# Patient Record
Sex: Female | Born: 1990 | Race: White | Hispanic: No | Marital: Single | Smoking: Never smoker
Health system: Southern US, Community
[De-identification: ages and names within clinical notes are randomized; demographics above are authoritative.]

---

## 2011-04-07 ENCOUNTER — Emergency Department (HOSPITAL_BASED_OUTPATIENT_CLINIC_OR_DEPARTMENT_OTHER)
Admission: EM | Admit: 2011-04-07 | Discharge: 2011-04-07 | Disposition: A | Discharge status: Home / Self Care | Payer: Managed Care, Other (non HMO) | Attending: Emergency Medicine | Admitting: Emergency Medicine

## 2011-04-07 ENCOUNTER — Emergency Department (INDEPENDENT_AMBULATORY_CARE_PROVIDER_SITE_OTHER): Payer: Managed Care, Other (non HMO)

## 2011-04-07 DIAGNOSIS — B2790 Infectious mononucleosis, unspecified without complication: Secondary | ICD-10-CM | POA: Insufficient documentation

## 2011-04-07 DIAGNOSIS — J351 Hypertrophy of tonsils: Secondary | ICD-10-CM

## 2011-04-07 LAB — DIFFERENTIAL
Basophils Absolute: 0 10*3/uL (ref 0.0–0.1)
Basophils Relative: 0 % (ref 0–1)
Eosinophils Absolute: 0 10*3/uL (ref 0.0–0.7)
Eosinophils Relative: 0 % (ref 0–5)
Metamyelocytes Relative: 0 %
Monocytes Absolute: 0.2 10*3/uL (ref 0.1–1.0)
Monocytes Relative: 1 % — ABNORMAL LOW (ref 3–12)
Neutro Abs: 9.3 10*3/uL — ABNORMAL HIGH (ref 1.7–7.7)
Neutrophils Relative %: 41 % — ABNORMAL LOW (ref 43–77)
nRBC: 0 /100 WBC

## 2011-04-07 LAB — BASIC METABOLIC PANEL
BUN: 2 mg/dL — ABNORMAL LOW (ref 6–23)
Calcium: 9.3 mg/dL (ref 8.4–10.5)
Creatinine, Ser: 0.5 mg/dL (ref 0.4–1.2)
GFR calc non Af Amer: >60 mL/min (ref >60)
Glucose, Bld: 98 mg/dL (ref 70–99)

## 2011-04-07 LAB — CBC
MCH: 29.6 pg (ref 26.0–34.0)
MCHC: 34 g/dL (ref 30.0–36.0)
Platelets: 216 10*3/uL (ref 150–400)

## 2011-04-07 LAB — RAPID STREP SCREEN (MED CTR MEBANE ONLY): Streptococcus, Group A Screen (Direct): NEGATIVE

## 2011-04-08 ENCOUNTER — Emergency Department (INDEPENDENT_AMBULATORY_CARE_PROVIDER_SITE_OTHER): Payer: Managed Care, Other (non HMO)

## 2011-04-08 ENCOUNTER — Emergency Department (HOSPITAL_BASED_OUTPATIENT_CLINIC_OR_DEPARTMENT_OTHER)
Admission: EM | Admit: 2011-04-08 | Discharge: 2011-04-08 | Disposition: A | Discharge status: Nursing Home (Includes ICF) | Payer: Managed Care, Other (non HMO) | Attending: Emergency Medicine | Admitting: Emergency Medicine

## 2011-04-08 DIAGNOSIS — J322 Chronic ethmoidal sinusitis: Secondary | ICD-10-CM | POA: Insufficient documentation

## 2011-04-08 DIAGNOSIS — J029 Acute pharyngitis, unspecified: Secondary | ICD-10-CM

## 2011-04-08 DIAGNOSIS — R748 Abnormal levels of other serum enzymes: Secondary | ICD-10-CM | POA: Insufficient documentation

## 2011-04-08 DIAGNOSIS — B2790 Infectious mononucleosis, unspecified without complication: Secondary | ICD-10-CM | POA: Insufficient documentation

## 2011-04-08 DIAGNOSIS — Z79899 Other long term (current) drug therapy: Secondary | ICD-10-CM | POA: Insufficient documentation

## 2011-04-08 DIAGNOSIS — R599 Enlarged lymph nodes, unspecified: Secondary | ICD-10-CM

## 2011-04-08 DIAGNOSIS — R509 Fever, unspecified: Secondary | ICD-10-CM

## 2011-04-08 LAB — DIFFERENTIAL
Basophils Absolute: 0 10*3/uL (ref 0.0–0.1)
Basophils Relative: 0 % (ref 0–1)
Eosinophils Absolute: 0 10*3/uL (ref 0.0–0.7)
Eosinophils Relative: 0 % (ref 0–5)
Lymphs Abs: 8.5 10*3/uL — ABNORMAL HIGH (ref 0.7–4.0)

## 2011-04-08 LAB — HEPATIC FUNCTION PANEL
AST: 223 U/L — ABNORMAL HIGH (ref 0–37)
Bilirubin, Direct: 0.3 mg/dL (ref 0.0–0.3)
Indirect Bilirubin: 1.7 mg/dL — ABNORMAL HIGH (ref 0.3–0.9)

## 2011-04-08 LAB — CBC
HCT: 39 % (ref 36.0–46.0)
MCHC: 33.6 g/dL (ref 30.0–36.0)
RDW: 13.2 % (ref 11.5–15.5)

## 2011-04-08 LAB — BASIC METABOLIC PANEL
Calcium: 8.8 mg/dL (ref 8.4–10.5)
GFR calc non Af Amer: >60 mL/min (ref >60)
Glucose, Bld: 94 mg/dL (ref 70–99)
Sodium: 137 mEq/L (ref 135–145)

## 2011-04-08 MED ORDER — IOHEXOL 350 MG/ML SOLN
100.0000 mL | Freq: Once | INTRAVENOUS | Status: AC | PRN
  Administered 2011-04-08: 100 mL via INTRAVENOUS

## 2012-01-11 IMAGING — CT CT NECK W/ CM
3 of 5 series · 13 of 33 positions shown, 16 images · IV contrast (omnipaque)
Comparison: 04/07/2011

CLINICAL DATA: Fever.  Sore throat.

CT NECK WITH CONTRAST
TECHNIQUE: Multidetector CT imaging of the neck was performed with
intravenous contrast.
Contrast: 100 ml Omnipaque 350

[Series 2: neck 2.0 b31s · axial · 0.40mm/px · z∈[-237,-77]mm · 5 of 120 slices shown, 7 images]
[im 20/120  soft-tissue]
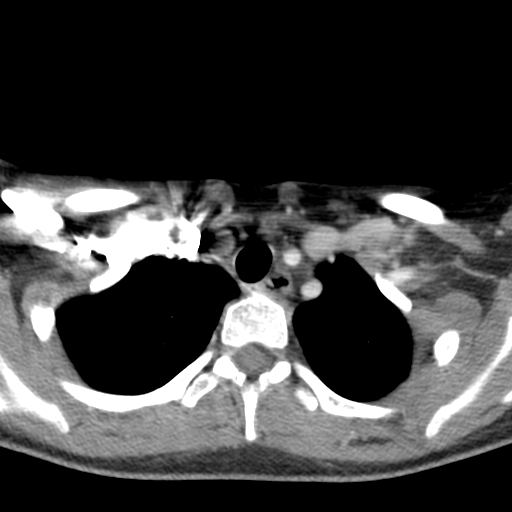
[im 20/120  bone]
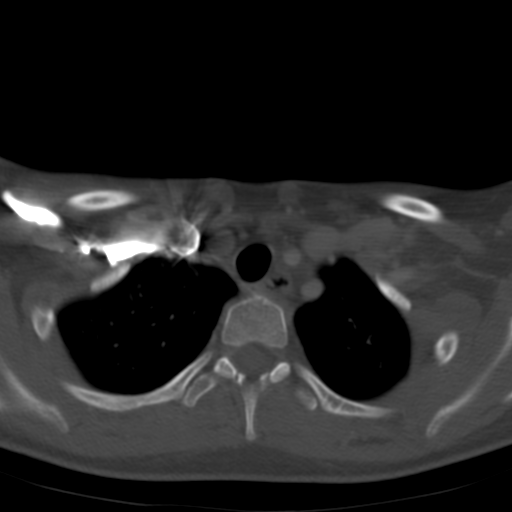
[im 40/120  bone]
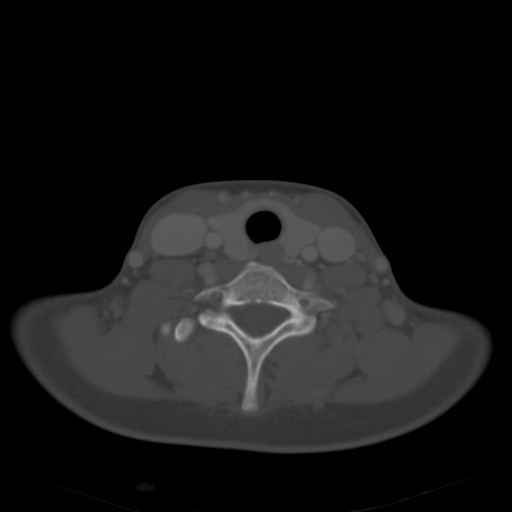
[im 60/120  bone]
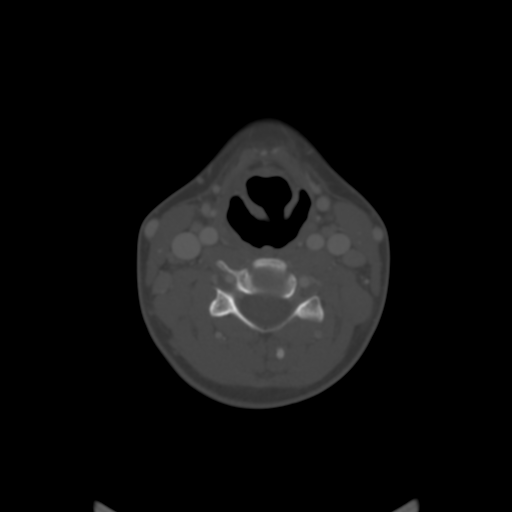
[im 80/120  bone]
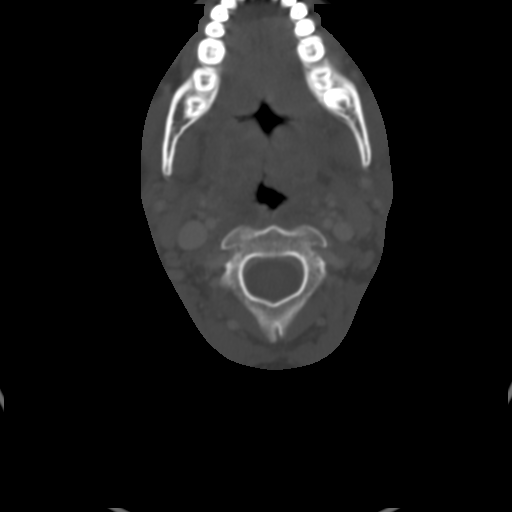
[im 100/120  soft-tissue]
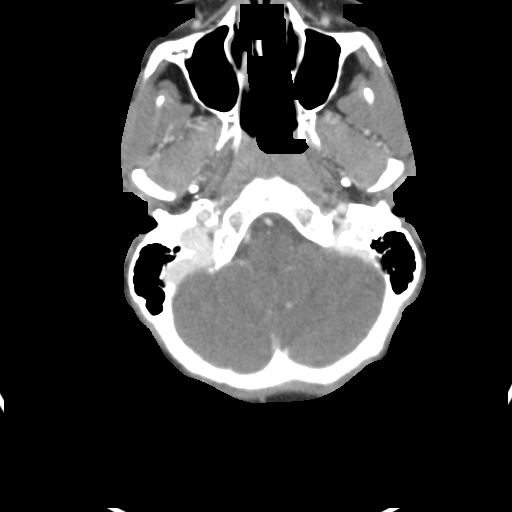
[im 100/120  bone]
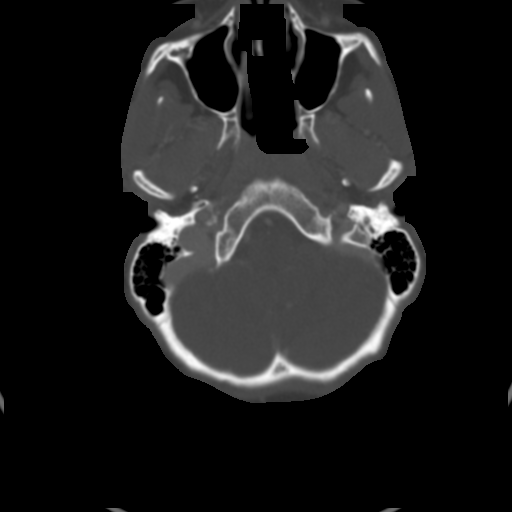

[Series 4: neck 2.0 coronal · coronal · 0.38mm/px · 3 of 71 slices shown]
[im 15/71  bone]
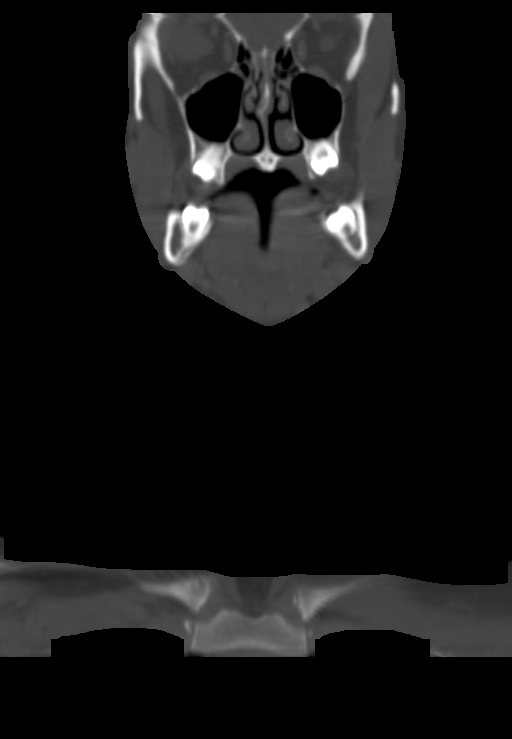
[im 29/71  bone]
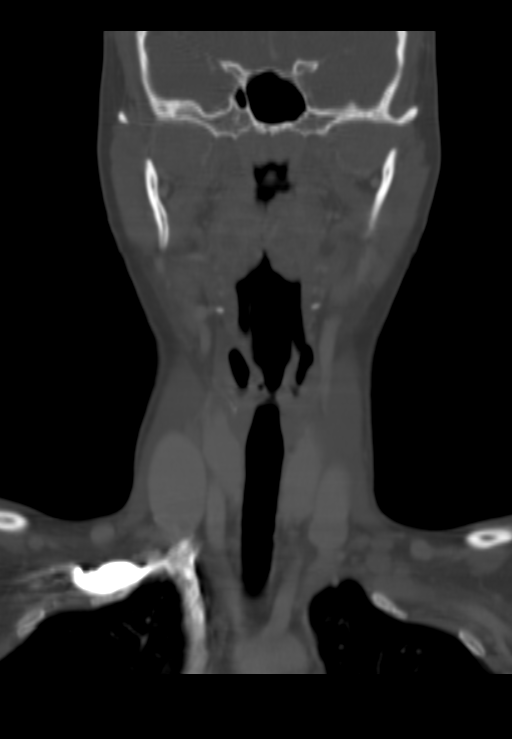
[im 43/71  bone]
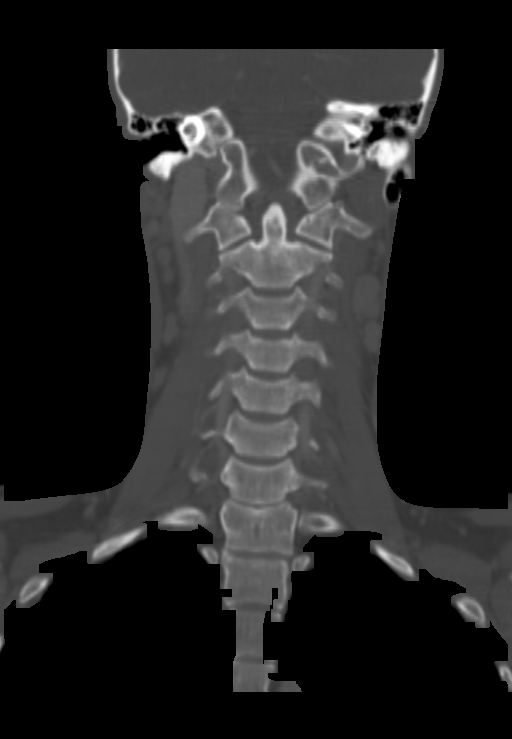

[Series 5: neck 2.0 sagittal · sagittal · 0.47mm/px · 5 of 70 slices shown, 6 images]
[im 24/70  bone]
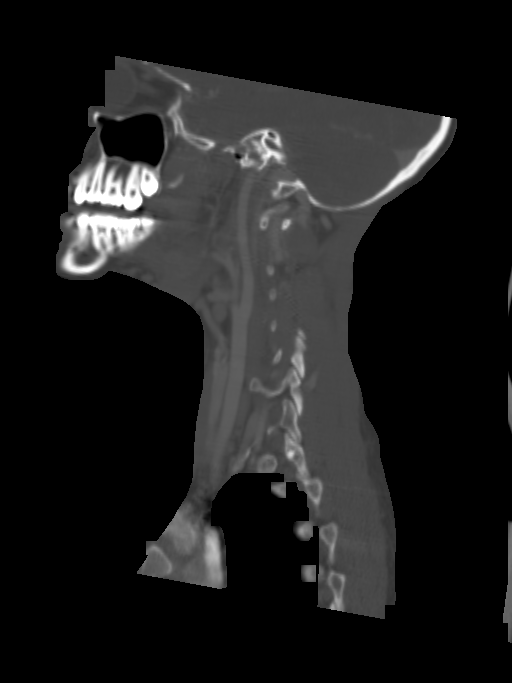
[im 29/70  bone]
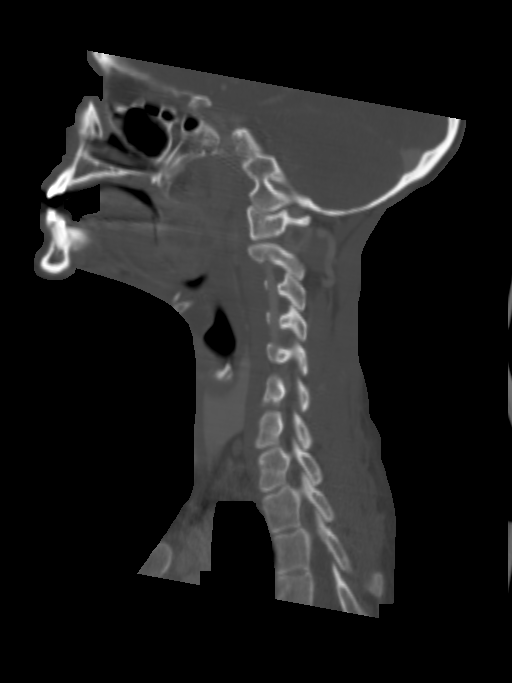
[im 35/70  soft-tissue]
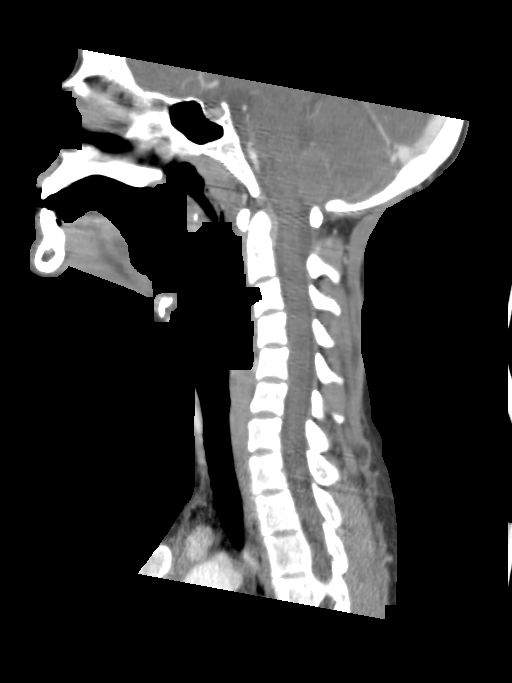
[im 35/70  bone]
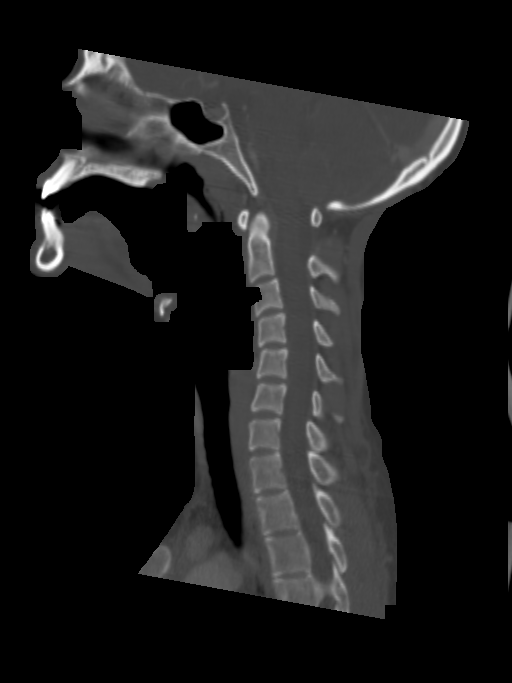
[im 41/70  bone]
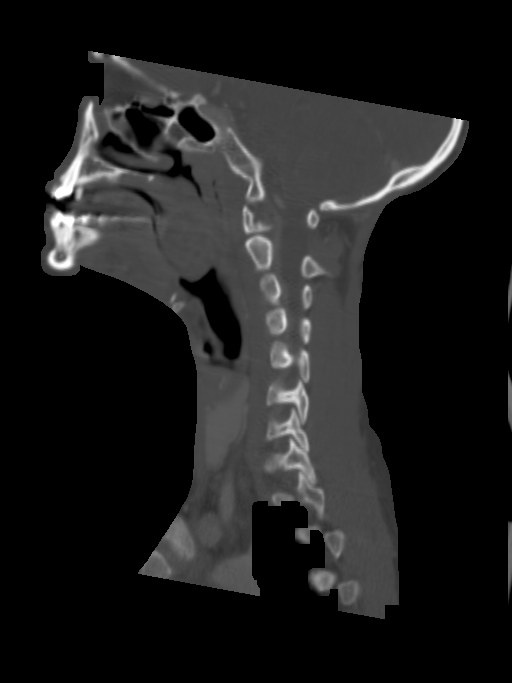
[im 47/70  bone]
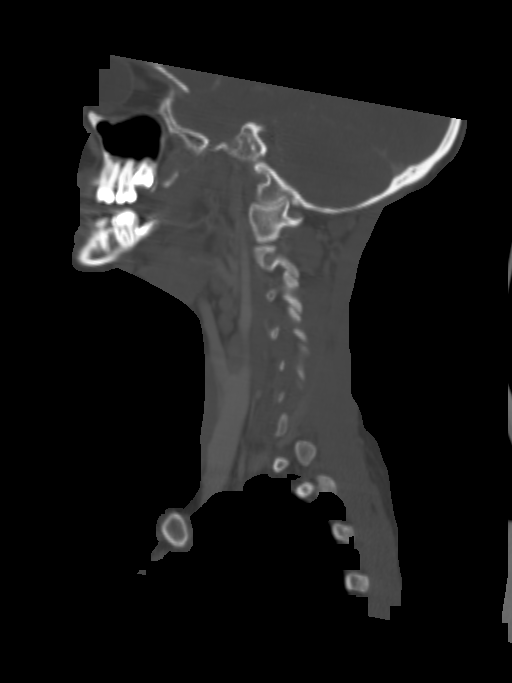

[13 of 33 positions shown; findings below may reference images not displayed]

FINDINGS: Mild chronic left ethmoid sinusitis is present.

There is considerable prominence of the palatine tonsils and
tonsillar pillars, with internal heterogeneity but without a
discrete/drainable tonsillar abscess.  The swelling is symmetric,
and mildly effaces the nasopharynx.

Otherwise the supraglottic and glottic tissues appear unremarkable.
No prevertebral soft tissue swelling or prevertebral abscess is
identified.  The adenoidal tissue is in the upper normal size range
for age.

Adenopathy noted in the neck, with a left station II lymph node
having a short axis diameter of 14.9 cm on image 46 of series 2,
and a right station II lymph node measuring 1.3 cm in short axis
diameter on image 48 of series 2.

Multiple additional scattered cervical and posterior triangle nodes
are present.  These are most likely reactive given the patient's
age and the tonsillar swelling.
IMPRESSION: 1.  Symmetric hypertrophy/prominence of the palatine tonsils and
tonsillar pillars, causing some effacement of the nasal airway.
The remaining glottic and supraglottic structures appear normal.
2.  Mild adenopathy in the neck, likely reactive.
3.  Mild chronic left ethmoid sinusitis.
4.  No drainable abscess identified.

## 2019-01-05 ENCOUNTER — Encounter: Payer: Self-pay | Admitting: Family Medicine

## 2019-01-05 ENCOUNTER — Ambulatory Visit: Payer: Managed Care, Other (non HMO) | Admitting: Family Medicine

## 2019-01-05 VITALS — BP 116/81 | HR 64 | Ht 62.0 in | Wt 115.0 lb

## 2019-01-05 DIAGNOSIS — M25579 Pain in unspecified ankle and joints of unspecified foot: Secondary | ICD-10-CM

## 2019-01-05 DIAGNOSIS — M2141 Flat foot [pes planus] (acquired), right foot: Secondary | ICD-10-CM | POA: Diagnosis not present

## 2019-01-05 DIAGNOSIS — M2142 Flat foot [pes planus] (acquired), left foot: Secondary | ICD-10-CM | POA: Diagnosis not present

## 2019-01-05 NOTE — Progress Notes (Signed)
PCP: Patient, No Pcp Per  Subjective:   HPI: Patient is a 28 y.o. female here for bilateral foot pain since May 2019.  Patient is currently 1/10.  It can be 5/10 at its worst.  She states her pain started after changing to a new job where she is standing all day.  Her pain is worse at the end of the workday.  It also worsens throughout the week.  It improves over the weekend when she is able to rest.  She localizes her pain to the plantar surface of the calcaneus as well as the lateral midfoot and proximal metatarsals.  Pain is worse with prolonged standing.  It did slightly improve after she buy new, more supportive shoes.  She denies any swelling, erythema, bruising.  No numbness or tingling.  No skin changes  History reviewed. No pertinent past medical history.  No current outpatient medications on file prior to visit.   No current facility-administered medications on file prior to visit.     History reviewed. No pertinent surgical history.  Allergies  Allergen Reactions  . Amoxicillin     Social History   Socioeconomic History  . Marital status: Single    Spouse name: Not on file  . Number of children: Not on file  . Years of education: Not on file  . Highest education level: Not on file  Occupational History  . Not on file  Social Needs  . Financial resource strain: Not on file  . Food insecurity:    Worry: Not on file    Inability: Not on file  . Transportation needs:    Medical: Not on file    Non-medical: Not on file  Tobacco Use  . Smoking status: Never Smoker  . Smokeless tobacco: Never Used  Substance and Sexual Activity  . Alcohol use: Not on file  . Drug use: Not on file  . Sexual activity: Not on file  Lifestyle  . Physical activity:    Days per week: Not on file    Minutes per session: Not on file  . Stress: Not on file  Relationships  . Social connections:    Talks on phone: Not on file    Gets together: Not on file    Attends religious service:  Not on file    Active member of club or organization: Not on file    Attends meetings of clubs or organizations: Not on file    Relationship status: Not on file  . Intimate partner violence:    Fear of current or ex partner: Not on file    Emotionally abused: Not on file    Physically abused: Not on file    Forced sexual activity: Not on file  Other Topics Concern  . Not on file  Social History Narrative  . Not on file    History reviewed. No pertinent family history.  BP 116/81   Pulse 64   Ht 5\' 2"  (1.575 m)   Wt 115 lb (52.2 kg)   BMI 21.03 kg/m   Review of Systems: See HPI above.     Objective:  Physical Exam:  Gen: awake, alert, NAD, comfortable in exam room Pulm: breathing unlabored  Right foot: Inspection:  No obvious bony deformity.  No swelling, erythema, or bruising.  Normal arch while seated.  Longitudinal arch collapse with standing.  Transverse arch collapse with observed toe splaying with standing.  Very mild calcaneal valgus Palpation: No tenderness to palpation ROM: Full  ROM of  the ankle. Normal midfoot flexibility Strength: 5/5 strength ankle in all planes Neurovascular: N/V intact distally in the lower extremity Special tests: Negative squeeze. Normal calcaneal motion with heel raise  Left foot: Inspection:  No obvious bony deformity.  No swelling, erythema, or bruising.  Normal arch while seated.  Longitudinal arch collapse with standing.  Transverse arch collapse with observed toe splaying with standing.  Very mild calcaneal valgus Palpation: No tenderness to palpation ROM: Full  ROM of the ankle. Normal midfoot flexibility Strength: 5/5 strength ankle in all planes Neurovascular: N/V intact distally in the lower extremity Special tests: Negative squeeze. Normal calcaneal motion with heel raise   Assessment & Plan:  1.  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  As result she has some heel pain and symptoms  of lateral column overload. -Today we placed her in green hapad inserts.  She will wear these for the next few weeks.  May consider adding a lateral heel wedge.  If she does notice improvement, consider custom orthotics

## 2019-01-05 NOTE — Patient Instructions (Signed)
Your exam is reassuring. Your pain is due to prolonged weightbearing with excessive pressure on the lateral column of your foot. Arch supports should make a big difference for this - wear this whenever you're up and walking around. We could add a lateral wedge or little more arch support if necessary but let us know how you're doing over the next 2-3 weeks. That follow-up would be a no-charge visit. Tylenol, motrin only if needed.

## 2019-01-06 ENCOUNTER — Encounter: Payer: Self-pay | Admitting: Family Medicine

## 2020-11-14 ENCOUNTER — Other Ambulatory Visit: Payer: Self-pay

## 2020-11-14 ENCOUNTER — Ambulatory Visit: Payer: Self-pay | Admitting: Family Medicine

## 2020-11-24 ENCOUNTER — Other Ambulatory Visit: Payer: Self-pay

## 2020-11-24 ENCOUNTER — Ambulatory Visit: Payer: Self-pay

## 2020-11-24 ENCOUNTER — Encounter: Payer: Self-pay | Admitting: Family Medicine

## 2020-11-24 ENCOUNTER — Ambulatory Visit (INDEPENDENT_AMBULATORY_CARE_PROVIDER_SITE_OTHER): Payer: Self-pay | Admitting: Family Medicine

## 2020-11-24 VITALS — BP 115/78 | HR 76 | Ht 62.0 in | Wt 116.0 lb

## 2020-11-24 DIAGNOSIS — M79672 Pain in left foot: Secondary | ICD-10-CM

## 2020-11-24 DIAGNOSIS — M79671 Pain in right foot: Secondary | ICD-10-CM

## 2020-11-24 NOTE — Patient Instructions (Signed)
Nice to meet you Please try the heel cups Please try the exercises  Please try voltaren   Please send me a message in MyChart with any questions or updates.  Please see me back in 4 weeks.   --Dr. Jordan Likes

## 2020-11-24 NOTE — Assessment & Plan Note (Signed)
Pain seems more associated with fat pad irritation.  Does not appear to be associated with the Achilles or the plantar fascia.  Has some stiffness within the ankle itself.  Seems less likely for stress reaction or fracture of the calcaneus. -Counseled on home exercise therapy and supportive care. -Try over-the-counter of the tear. -Try gel heel cups. -Provided work note. -Could consider physical therapy or imaging.

## 2020-11-24 NOTE — Progress Notes (Signed)
Kathryn Perez - 29 y.o. female MRN [id number]  Date of birth: Dec 17, 1990  SUBJECTIVE:  Including CC & ROS.  Chief Complaint  Patient presents with  . Ankle Pain    Bilateral ankle and foot    Kathryn Perez is a 29 y.o. female that is presenting with bilateral heel pain.  Fairly acute in nature.  She is feeling the pain at the posterior aspect of each calcaneus.  Has gotten worse for the past 2 weeks.  Has been intermittent in nature.  She has trouble going up and down the stairs.  She has trouble running for any period of time.  Denies any specific injury.  She does lift frequently job.  No prior history of surgery.  Has not tried any medications..   Review of Systems See HPI   HISTORY: Past Medical, Surgical, Social, and Family History Reviewed & Updated per EMR.   Pertinent Historical Findings include:  No past medical history on file.  No past surgical history on file.  No family history on file.  Social History   Socioeconomic History  . Marital status: Single    Spouse name: Not on file  . Number of children: Not on file  . Years of education: Not on file  . Highest education level: Not on file  Occupational History  . Not on file  Tobacco Use  . Smoking status: Never Smoker  . Smokeless tobacco: Never Used  Substance and Sexual Activity  . Alcohol use: Not on file  . Drug use: Not on file  . Sexual activity: Not on file  Other Topics Concern  . Not on file  Social History Narrative  . Not on file   Social Determinants of Health   Financial Resource Strain: Not on file  Food Insecurity: Not on file  Transportation Needs: Not on file  Physical Activity: Not on file  Stress: Not on file  Social Connections: Not on file  Intimate Partner Violence: Not on file     PHYSICAL EXAM:  VS: BP 115/78   Pulse 76   Ht 5\' 2"  (1.575 m)   Wt 116 lb (52.6 kg)   BMI 21.22 kg/m  Physical Exam Gen: NAD, alert, cooperative with exam, well-appearing MSK:  Right  and left foot: No swelling or ecchymosis.   Has abnormal diagonal pattern of the longitudinal arch.   Has loss of the transverse arch with widening of the forefoot. Stiffness with limitations and flexion extension of the ankles. Gait: Walking fairly stiff leg. Neutral gait. Can place hands flat on floor. Neurovascular intact  Limited ultrasound: Right and left foot:  Right foot: Normal-appearing plantar fascia normal measurement. No increased hyperemia of the calcaneus or of the subcutaneous tissue. No changes of the Achilles.   There is a mild retrocalcaneal bursitis.  Left foot: Normal-appearing plantar fascia with normal measurement. No increased hyperemia of the calcaneus of the subcutaneous tissue. Normal-appearing Achilles. No retrocalcaneal bursitis.  Summary: No significant changes at the site of her pain.  Ultrasound and interpretation by Marland Kitchen, MD    ASSESSMENT & PLAN:   Foot pain, bilateral Pain seems more associated with fat pad irritation.  Does not appear to be associated with the Achilles or the plantar fascia.  Has some stiffness within the ankle itself.  Seems less likely for stress reaction or fracture of the calcaneus. -Counseled on home exercise therapy and supportive care. -Try over-the-counter of the tear. -Try gel heel cups. -Provided work note. -Could consider physical therapy  or imaging.

## 2023-03-25 ENCOUNTER — Encounter: Payer: Self-pay | Admitting: *Deleted
